# Patient Record
Sex: Female | Born: 1970 | Hispanic: No | Marital: Married | Smoking: Never smoker
Health system: Southern US, Community
[De-identification: ages and names within clinical notes are randomized; demographics above are authoritative.]

## PROBLEM LIST (undated history)

## (undated) DIAGNOSIS — G4700 Insomnia, unspecified: Secondary | ICD-10-CM

## (undated) DIAGNOSIS — F419 Anxiety disorder, unspecified: Secondary | ICD-10-CM

## (undated) HISTORY — PX: PARTIAL HYSTERECTOMY: SHX80

## (undated) HISTORY — DX: Insomnia, unspecified: G47.00

## (undated) HISTORY — PX: ECTOPIC PREGNANCY SURGERY: SHX613

## (undated) HISTORY — DX: Anxiety disorder, unspecified: F41.9

---

## 1998-02-08 ENCOUNTER — Inpatient Hospital Stay (HOSPITAL_COMMUNITY): Admission: AD | Admit: 1998-02-08 | Discharge: 1998-02-08 | Payer: Self-pay | Admitting: Obstetrics and Gynecology

## 1998-02-09 ENCOUNTER — Inpatient Hospital Stay (HOSPITAL_COMMUNITY): Admission: AD | Admit: 1998-02-09 | Discharge: 1998-02-11 | Payer: Self-pay | Admitting: Obstetrics & Gynecology

## 1998-03-17 ENCOUNTER — Other Ambulatory Visit: Admission: RE | Admit: 1998-03-17 | Discharge: 1998-03-17 | Payer: Self-pay | Admitting: Obstetrics and Gynecology

## 1999-06-18 ENCOUNTER — Other Ambulatory Visit: Admission: RE | Admit: 1999-06-18 | Discharge: 1999-06-18 | Payer: Self-pay | Admitting: Obstetrics and Gynecology

## 2000-06-17 ENCOUNTER — Other Ambulatory Visit: Admission: RE | Admit: 2000-06-17 | Discharge: 2000-06-17 | Payer: Self-pay | Admitting: Obstetrics and Gynecology

## 2001-09-06 ENCOUNTER — Encounter: Payer: Self-pay | Admitting: Obstetrics and Gynecology

## 2001-09-06 ENCOUNTER — Encounter (INDEPENDENT_AMBULATORY_CARE_PROVIDER_SITE_OTHER): Payer: Self-pay

## 2001-09-07 ENCOUNTER — Inpatient Hospital Stay (HOSPITAL_COMMUNITY): Admission: AD | Admit: 2001-09-07 | Discharge: 2001-09-08 | Payer: Self-pay | Admitting: Obstetrics and Gynecology

## 2001-09-20 ENCOUNTER — Other Ambulatory Visit: Admission: RE | Admit: 2001-09-20 | Discharge: 2001-09-20 | Payer: Self-pay | Admitting: Obstetrics and Gynecology

## 2003-05-08 ENCOUNTER — Other Ambulatory Visit: Admission: RE | Admit: 2003-05-08 | Discharge: 2003-05-08 | Payer: Self-pay | Admitting: Obstetrics and Gynecology

## 2004-05-20 ENCOUNTER — Other Ambulatory Visit: Admission: RE | Admit: 2004-05-20 | Discharge: 2004-05-20 | Payer: Self-pay | Admitting: Obstetrics and Gynecology

## 2004-10-02 ENCOUNTER — Ambulatory Visit (HOSPITAL_COMMUNITY): Admission: RE | Admit: 2004-10-02 | Discharge: 2004-10-02 | Payer: Self-pay | Admitting: Obstetrics and Gynecology

## 2005-05-26 ENCOUNTER — Inpatient Hospital Stay (HOSPITAL_COMMUNITY): Admission: RE | Admit: 2005-05-26 | Discharge: 2005-05-29 | Payer: Self-pay | Admitting: Obstetrics and Gynecology

## 2005-05-26 ENCOUNTER — Encounter (INDEPENDENT_AMBULATORY_CARE_PROVIDER_SITE_OTHER): Payer: Self-pay | Admitting: Specialist

## 2005-07-07 ENCOUNTER — Other Ambulatory Visit: Admission: RE | Admit: 2005-07-07 | Discharge: 2005-07-07 | Payer: Self-pay | Admitting: Obstetrics and Gynecology

## 2008-11-07 ENCOUNTER — Encounter (INDEPENDENT_AMBULATORY_CARE_PROVIDER_SITE_OTHER): Payer: Self-pay | Admitting: Obstetrics and Gynecology

## 2008-11-07 ENCOUNTER — Ambulatory Visit (HOSPITAL_COMMUNITY): Admission: RE | Admit: 2008-11-07 | Discharge: 2008-11-08 | Payer: Self-pay | Admitting: Obstetrics and Gynecology

## 2009-08-18 ENCOUNTER — Emergency Department (HOSPITAL_COMMUNITY): Admission: EM | Admit: 2009-08-18 | Discharge: 2009-08-18 | Payer: Self-pay | Admitting: Emergency Medicine

## 2010-08-05 LAB — DIFFERENTIAL
Basophils Absolute: 0 10*3/uL (ref 0.0–0.1)
Basophils Relative: 0 % (ref 0–1)
Eosinophils Absolute: 0.1 10*3/uL (ref 0.0–0.7)
Monocytes Relative: 6 % (ref 3–12)
Neutro Abs: 8.4 10*3/uL — ABNORMAL HIGH (ref 1.7–7.7)
Neutrophils Relative %: 80 % — ABNORMAL HIGH (ref 43–77)

## 2010-08-05 LAB — BASIC METABOLIC PANEL
BUN: 12 mg/dL (ref 6–23)
CO2: 25 mEq/L (ref 19–32)
Calcium: 8.9 mg/dL (ref 8.4–10.5)
Chloride: 108 mEq/L (ref 96–112)
Creatinine, Ser: 0.87 mg/dL (ref 0.4–1.2)
Glucose, Bld: 115 mg/dL — ABNORMAL HIGH (ref 70–99)

## 2010-08-05 LAB — URINALYSIS, ROUTINE W REFLEX MICROSCOPIC
Bilirubin Urine: NEGATIVE
Glucose, UA: NEGATIVE mg/dL
Ketones, ur: NEGATIVE mg/dL
Leukocytes, UA: NEGATIVE
Nitrite: NEGATIVE
Protein, ur: 30 mg/dL — AB
Specific Gravity, Urine: 1.025 (ref 1.005–1.030)
Urobilinogen, UA: 0.2 mg/dL (ref 0.0–1.0)
pH: 6.5 (ref 5.0–8.0)

## 2010-08-05 LAB — CBC
MCHC: 33.8 g/dL (ref 30.0–36.0)
MCV: 86.5 fL (ref 78.0–100.0)
RBC: 4.44 MIL/uL (ref 3.87–5.11)
RDW: 13.7 % (ref 11.5–15.5)

## 2010-08-05 LAB — URINE MICROSCOPIC-ADD ON

## 2010-08-24 LAB — CBC
Hemoglobin: 10.5 g/dL — ABNORMAL LOW (ref 12.0–15.0)
MCHC: 34.4 g/dL (ref 30.0–36.0)
MCV: 81 fL (ref 78.0–100.0)
Platelets: 245 10*3/uL (ref 150–400)
Platelets: 246 10*3/uL (ref 150–400)
RDW: 16 % — ABNORMAL HIGH (ref 11.5–15.5)
WBC: 12.2 10*3/uL — ABNORMAL HIGH (ref 4.0–10.5)
WBC: 5.5 10*3/uL (ref 4.0–10.5)

## 2010-08-24 LAB — PREGNANCY, URINE: Preg Test, Ur: NEGATIVE

## 2010-09-29 NOTE — Op Note (Signed)
Stacey Gonzales, Stacey Gonzales               ACCOUNT NO.:  [id number]   MEDICAL RECORD NO.:  [id number]          PATIENT TYPE:  OIB   LOCATION:  9307                          FACILITY:  [facility]   PHYSICIAN:  Michelle L. Grewal, M.D.DATE OF BIRTH:  Mar 20, 1971   DATE OF PROCEDURE:  11/07/2008  DATE OF DISCHARGE:                               OPERATIVE REPORT   PREOPERATIVE DIAGNOSIS:  Dysfunctional uterine bleeding.   POSTOPERATIVE DIAGNOSIS:  Dysfunctional uterine bleeding.   PROCEDURE:  Laparoscopic-assisted vaginal hysterectomy.   SURGEON:  Michelle L. Vincente Poli, MD   ASSISTANT:  Juluis Mire, MD   ANESTHESIA:  General.   PATHOLOGY:  Uterus and cervix sent for routine path.   DRAINS:  Foley.   ESTIMATED BLOOD LOSS:  200 mL.   DESCRIPTION OF PROCEDURE:  The patient was taken to the operating room  after informed consent was obtained.  She was intubated.  She was then  prepped and draped in the usual sterile fashion.  A uterine manipulator  was inserted, a Foley catheter was inserted and draining clear urine.  Attention was turned to the abdomen.  A small infraumbilical incision  was made and the Veress needle was inserted.  Pneumoperitoneum was  performed.  The Veress needle was removed and the 11-mm trocar was  inserted.  The laparoscope was introduced through the trocar sheath.  A  5-mm trocar was inserted suprapubically under direct visualization.  Exam of the pelvis revealed the following uterus was slightly enlarged.  She was status post bilateral tubal ligation.  Her ovaries were  unremarkable.  No endometriosis was noted.  I then placed an atraumatic  grasper and identified the triple pedicle and elevated it, and then  placed the Enseal instrument across the triple pedicle and burned the  triple pedicle and carried it down to the round ligament without any  difficulty.  This was done initially on the left side and then on the  right side.  Hemostasis was excellent.  At that point  we were removed  everything except for the trocars and released the pneumoperitoneum.  A  weighted speculum was placed in the vagina.  A circumferential incision  was made around the cervix.  The posterior cul-de-sac was entered  sharply using Mayo scissors and the anterior cul-de-sac was entered  sharply using Metzenbaum scissors.  Using the curved Heaney clamp, the  uterosacral cardinal ligament was clamped on either side.  Each pedicle  was clamped, cut, and suture ligated using 0 Vicryl suture.  We  carefully walked up the broad ligament staying just beside cervix and  the uterus and each pedicle was clamped, cut, and suture ligated using 0  Vicryl suture.  Once we reached the level of the uterine fundus, uterus  was retroflexed, the remainder of the broad ligament was clamped on  either side.  The specimen was removed and identified as uterus and  cervix.  The pedicles were secured using a suture ligature of 0 Vicryl  suture.  The posterior cuff was then closed from 3-9 o'clock in a  running locked stitch using a 0 Vicryl suture,  and then the cuff was  closed completely anterior to posterior using 0 Vicryl suture in a  running stitch.  At this point, I changed my gloves and went back up to  the abdomen.  The pneumoperitoneum was replaced.  The laparoscope was  introduced through the trocar sheath.  The irrigation of the pelvis was  performed.  Hemostasis was excellent.  I placed a piece of Intercede was  placed across the vaginal cuff to hopefully prevent adhesion formation  of the bowel to the vaginal cuff.  No bleeding was noted whatsoever.  The pneumoperitoneum was released.  Hemostasis was again noted.  The  trocars were removed.  The incisions were closed with the 0 Vicryl at  the umbilical with a deep stitch and then the remainder of the skin was  closed with a deep subcuticular using 3-0 Vicryl interrupted.  All  sponge, lap, and instrument counts were correct x2.  The patient  went to  recovery room in stable condition.      Michelle L. Vincente Poli, M.D.  Electronically Signed     MLG/MEDQ  D:  11/07/2008  T:  11/08/2008  Job:  [id number]

## 2010-09-29 NOTE — H&P (Signed)
Stacey Gonzales, Stacey Gonzales               ACCOUNT NO.:  [id number]   MEDICAL RECORD NO.:  [id number]          PATIENT TYPE:  AMB   LOCATION:  SDC                           FACILITY:  [facility]   PHYSICIAN:  Michelle L. Grewal, M.D.DATE OF BIRTH:  01-Jul-1970   DATE OF ADMISSION:  DATE OF DISCHARGE:                              HISTORY & PHYSICAL   Her surgery is on November 07, 2008, at [facility] at 7:30.   HISTORY OF PRESENT ILLNESS:  This patient is a 40 year old gravida 3,  para 2, LMP November 01, 2008, presents today for hysterectomy.  She had an  endometrial ablation in the past for menorrhagia and this helped  menorrhagia.  Her period now lasts for 2 weeks.  She has had a tubal  ligation in the past.  She has also had a history of an ectopic  pregnancy that ruptured and had a right salpingectomy in the past.  She  has also had 2 previous C-sections.   MEDICAL HISTORY:  Significant for history of abnormal Pap smear, history  of headaches and anxiety.   MEDICATIONS:  Paxil CR 12.5 mg a day.   SURGERY:  C-section x2.   ALLERGY:  SULFA.   SOCIAL HISTORY:  Denies any cigarettes or alcohol.  She is married.   REVIEW OF SYSTEMS:  Positive for fatigue, weight gain and insomnia and  headache.   PHYSICAL EXAMINATION:  VITAL SIGNS:  Weight 151, BP 126/80.  Hemoglobin  11.5.  GENERAL:  Alert and oriented.  No apparent distress.  LUNGS:  Clear to auscultation bilaterally.  CARDIAC:  Regular rate and rhythm.  BREASTS:  Soft, nontender, no masses.  PELVIC:  External genitalia within normal limits.  Vagina appears  normal.  Cervix, no lesions.  Uterus is normal size and nontender.  Adnexa nontender.  No masses.   IMPRESSION:  Dysfunctional uterine bleeding status post endometrial  ablation.   PLAN:  We will proceed with LAVH.  The risks and benefits have been  reviewed with the patient and we will proceed with the surgery as  scheduled.      Michelle L. Vincente Poli, M.D.  Electronically  Signed    MLG/MEDQ  D:  11/05/2008  T:  11/06/2008  Job:  [id number]

## 2010-10-02 NOTE — Op Note (Signed)
NAMERONNITA, PAZ               ACCOUNT NO.:  [id number]   MEDICAL RECORD NO.:  [id number]          PATIENT TYPE:  INP   LOCATION:  9116                          [facility]ACILITY:  [facility]   PHYSICIAN:  Michelle L. Grewal, M.D.DATE OF BIRTH:  [birth year]   DATE OF PROCEDURE:  05/26/2005  DATE OF DISCHARGE:                                 OPERATIVE REPORT   PREOPERATIVE DIAGNOSIS:  Intrauterine pregnancy at term, previous cesarean  section and desires permanent sterilization.   POSTOPERATIVE DIAGNOSIS:  Intrauterine pregnancy at term, previous cesarean  section and desires permanent sterilization.   PROCEDURE:  Repeat low transverse cesarean section and left salpingectomy.   SURGEON:  Michelle L. Vincente Poli, M.D.   ANESTHESIA:  Spinal.   SPECIMENS:  Female infant Apgars 8 at 1 minute and 9 at 5 minutes, weighing  6 pounds 10 ounces.   ESTIMATED BLOOD LOSS:  Was 500 mL.   COMPLICATIONS:  None.   PATHOLOGY:  Left fallopian tube.   PROCEDURE:  Patient was taken to the operating room. Spinal placed by  anesthesia. She was prepped and draped in usual sterile fashion. A Foley  catheter was inserted. A low transverse incision was made, carried down to  the fascia and extended laterally. Rectus muscles were separated in midline.  The peritoneum was entered bluntly. The bladder blade was inserted, lower  uterine segment was identified and the bladder flap was created sharply and  then digitally. The bladder blade was readjusted. A low transverse incision  was made in the uterus. The baby was delivered easily with one gentle pull  with a vacuum with no pop-off. She is a female infant with Apgars 8 at one  minute and 9 at one minute weighing 6 pounds 10 ounces. The cord was clamped  and cut. Baby was handed to the waiting pediatric team. Placenta was  manually removed and noted to be normal and intact with three-vessel cord.  The uterus was exteriorized and cleared of all clots and debris. She  was  noted to be status post right salpingectomy from history of ruptured right  ectopic pregnancy. Since the patient desires permanent sterilization. We had  easily identified a normal left fallopian tube and it was grasped in the  midportion with Babcock clamp and a 3 cm knuckle was tied off x2 with plain  gut suture. That excised knuckle was then excised using Metzenbaum scissors.  Hemostasis was noted. The incision was closed using 0 chromic suture running  locked stitch. The uterus was returned to the abdomen. Irrigation was  performed. Hemostasis was again noted. The peritoneum was closed using 0  Vicryl suture in continuous running stitch and the rectus muscles were  reapproximated using same 0 Vicryl. The  fascia was closed using 0 Vicryl suture in continuous running stitch x2  starting at each corner and meeting in the midline. After irrigation of  subcutaneous layers, skin was closed with staples. All sponge, lap and  instrument counts were correct x2. The patient went to recovery room in  stable condition.      Michelle L. Vincente Poli, M.D.  Electronically Signed  MLG/MEDQ  D:  05/26/2005  T:  05/26/2005  Job:  [id number]

## 2010-10-02 NOTE — Op Note (Signed)
[facility]  Patient:    Stacey Gonzales, Stacey Gonzales Visit Number: [id number] MRN: [id number]          Service Type: OBV Location: 9300 [id number] Attending Physician:  Rhina Brackett Dictated by:   Marcelle Overlie, M.D. Admit Date:  09/06/2001                             Operative Report  PREOPERATIVE DIAGNOSIS:       Ruptured right ectopic pregnancy.  POSTOPERATIVE DIAGNOSIS:      Ruptured right ectopic pregnancy.  OPERATION:                    Exploratory laparotomy, right salpingectomy and removal of hemoperitoneum.  SURGEON:                      Marcelle Overlie, M.D.  ANESTHESIA:                   General.  ESTIMATED BLOOD LOSS:         300 cc.  DESCRIPTION OF PROCEDURE:     The patient was taken to the operating room emergently.  She was then sedated without difficulty and she was prepped and draped in the usual sterile fashion after Foley catheter was placed in the bladder.  Using the scalpel, a small mini laparotomy incision was made at the area of her previous cesarean section scar and carried down to the fascia. The fascia was scored in the midline and extended laterally.  The rectus muscles were separated both superiorly and inferiorly.  The peritoneum was entered bluntly and the peritoneal incision was then stretched.  Upon entering into the abdominal cavity, there was noted to be approximately 300 cc of hemoperitoneum, some with organized clots.  As the uterus was lifted up to the incision, the uterus appeared normal.  The left tube and ovary appeared normal in appearance and the right tube was extremely distorted and bleeding quite heavily from an ectopic pregnancy which was partially extruded from the fimbriated end.  The right ovary appeared normal.  Because of the extreme distortion of the fallopian tube and the large amount of bleeding, a salpingectomy was performed by placing a Kelly clamp across the tube.  The tube was removed  using Mayo scissors and the pedicle was secured using a free-tie of 0 Vicryl suture.  Irrigation was then performed and as much hemoperitoneum was removed as possible.  The surgical site was inspected and noted to be hemostatic.  The peritoneum was closed using 0 Vicryl in continuous running stitch and the rectus muscles were reapproximated using the same 0 Vicryl.  The fascia was closed using 0 Vicryl in continuous running stitch and after irrigation of the subcutaneous layer, the skin was closed with subcuticular stitches using 4-0 Monocryl.  All sponge, lab and instrument counts were correct x 2.  The patient tolerated the procedure well and went to the recovery room in stable condition. Dictated by:   Marcelle Overlie, M.D. Attending Physician:  Rhina Brackett DD:  09/06/01 TD:  09/06/01 Job: [id number]

## 2010-10-02 NOTE — Discharge Summary (Signed)
[name], Stacey Gonzales               ACCOUNT NO.:  [id number]   MEDICAL RECORD NO.:  [id number]          PATIENT TYPE:  INP   LOCATION:  9116                          FACILITY:  [facility]   PHYSICIAN:  Freddy Finner, M.D.   DATE OF BIRTH:  02-13-1971   DATE OF ADMISSION:  05/26/2005  DATE OF DISCHARGE:  05/29/2005                                 DISCHARGE SUMMARY   ADMITTING DIAGNOSES:  1.  Intrauterine pregnancy at term.  2.  Previous cesarean section, desires repeat.  3.  Multiparity, desires permanent sterilization.   DISCHARGE DIAGNOSES:  1.  Status post low transverse cesarean section.  2.  Viable female infant.  3.  Permanent sterilization.   PROCEDURE:  1.  Repeat low transverse cesarean section.  2.  Left salpingectomy.   REASON FOR ADMISSION:  Please see written H&P.   HOSPITAL COURSE:  The patient is a 40 year old gravida 3, para 1 who  presented to [facility] for a scheduled repeat cesarean  section and permanent sterilization. The patient had a history of an  exploratory laparotomy for an ectopic pregnancy with previous right  salpingectomy. The patient also had had a previous cesarean section and  desired repeat. Due to multiparity, the patient also requested permanent  sterilization. On the morning of admission, the patient was taken to the  operating room where spinal anesthesia was administered without difficulty.  A low transverse incision was made with delivery of a viable female infant  weighing 6 pounds 10 ounces, Apgars of 9 and one minute and 9 at five  minutes. Left salpingectomy was performed without difficulty. The patient  tolerated the procedure well and was taken to the recovery room in stable  condition.   On postoperative day #1, the patient was without complaint. Vital signs were  stable. She was afebrile. Abdomen was soft with good return of bowel  function. Fundus was firm and nontender. Abdominal dressing was noted to be  clean,  dry and intact. Laboratory findings revealed hemoglobin of 9.0,  platelet count of 219,000, WBC count of 8.2.   On postoperative day #2, the patient was without complaint. Vital signs were  stable.  She was afebrile. Abdomen was soft with good return of bowel  function. Fundus was firm and nontender. Incision was clean, dry and intact.  The patient was ambulating well and tolerating a regular diet without  complaints of nausea and vomiting.   On postoperative day #3, the patient was without complaint. Vital signs  remained stable. She was afebrile. Fundus was firm and nontender. Incision  was clean, dry and intact. Staples were removed, and the patient was later  discharged home.   CONDITION ON DISCHARGE:  Good.   DIET:  Regular as tolerated.   ACTIVITY:  No heavy lifting, no driving x2 weeks, no vaginal entry.   FOLLOW UP:  Patient to follow up in the office in 1 week for an incision  check. She is to call for temperature greater than 100 degrees, persistent  nausea or vomiting, heavy vaginal bleeding and/or redness or drainage from  the incisional site.  DISCHARGE MEDICATIONS:  1.  Percocet 5/325, #30, one p.o. q.4-6h. p.r.n.  2.  Motrin 600 milligrams every 6 hours.  3.  Prenatal vitamins one p.o. b.i.d. with meals.      Julio Sicks, N.P.      Freddy Finner, M.D.  Electronically Signed    CC/MEDQ  D:  06/14/2005  T:  06/14/2005  Job:  [id number]

## 2010-10-02 NOTE — Discharge Summary (Signed)
[facility]  Patient:    Stacey Gonzales, Stacey Gonzales Visit Number: [id number] MRN: [id number]          Service Type: OBS Location: 9300 [id number] Attending Physician:  Rhina Brackett Dictated by:   Julio Sicks, N.P. Admit Date:  09/06/2001 Discharge Date: 09/08/2001                             Discharge Summary  ADMITTING DIAGNOSIS:          Ruptured right ectopic pregnancy.  DISCHARGE DIAGNOSIS:          Ruptured right ectopic pregnancy, status post laparotomy and right salpingectomy.  PROCEDURE:                    Exploratory laparotomy, right salpingectomy, and removal of hemoperitoneum.  REASON FOR ADMISSION:         Please see dictated H&P.  HOSPITAL COURSE:              The patient presents to maternity admissions with complaints of right lower quadrant pain.  The patient was seen in the office with a positive pregnancy test and some acute abdominal pain. Ultrasound was ordered, which revealed a right ectopic pregnancy with some hemoperitoneum.  The patient was taken to the operating room emergently for an exploratory laparotomy.  Ectopic pregnancy was noted in the right tube with heavy bleeding.  A right salpingectomy was performed without complication.  On postoperative day #1, the patient had good return of bowel function.  The abdomen was slightly tender.  The bandage was clean, dry, and intact.  Labs revealed a hemoglobin of 9.0, hematocrit of 26.4, WBC of 5.8.  On postoperative day #2, the patient was doing well.  Temperature was noted to be slightly elevated at 100.1 degrees Fahrenheit maximum.  The abdomen was soft and slightly tender but without rebound tenderness.  A low-grade temperature was thought to be related to hemoperitoneum.  The incision was clean, dry, and intact and the patient was discharged home.  CONDITION ON DISCHARGE:       Good.  DIET:                         Regular as tolerated.  ACTIVITY:                     No  heavy lifting, no driving x2 weeks, no vaginal entry.  FOLLOWUP:                     The patient is to follow up in the office in two weeks for an incision check.  The patient is to call for temperature greater than 100.5 degrees, persistent nausea and vomiting, heavy vaginal bleeding, and/or redness or drainage from the incision site.  DISCHARGE MEDICATIONS:        1. Tylox 1-2 every 4-6 hours p.r.n. pain.                               2. Motrin 600 mg over-the-counter every six                                  hours p.r.n.  3. Restoril 30 mg 1 p.o. at h.s. p.r.n.                                  insomnia. Dictated by:   Julio Sicks, N.P. Attending Physician:  Rhina Brackett DD:  09/27/01 TD:  09/30/01 Job: [id number]

## 2011-10-07 IMAGING — CT CT ABD-PELV W/O CM
2 of 4 series · 17 of 46 positions shown, 19 images · non-contrast
Comparison: None.

CLINICAL DATA: Left flank pain

CT ABDOMEN AND PELVIS WITHOUT CONTRAST
TECHNIQUE: Multidetector CT imaging of the abdomen and pelvis was
performed following the standard protocol without intravenous
contrast.

[Series 2: under 200# stone no prev · axial · 0.74mm/px · z∈[-468,-98]mm · 14 of 80 slices shown, 16 images]
[im 3/80  soft-tissue]
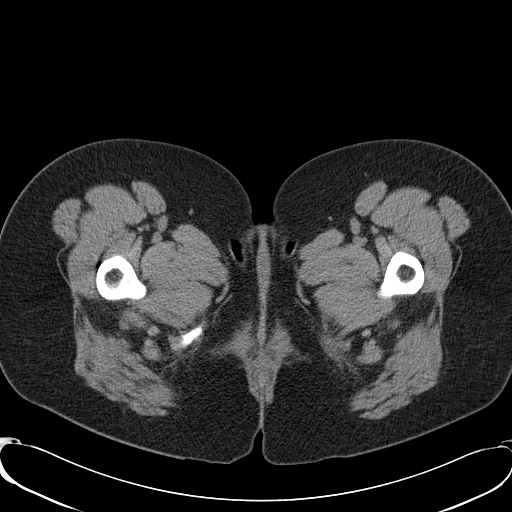
[im 3/80  bone]
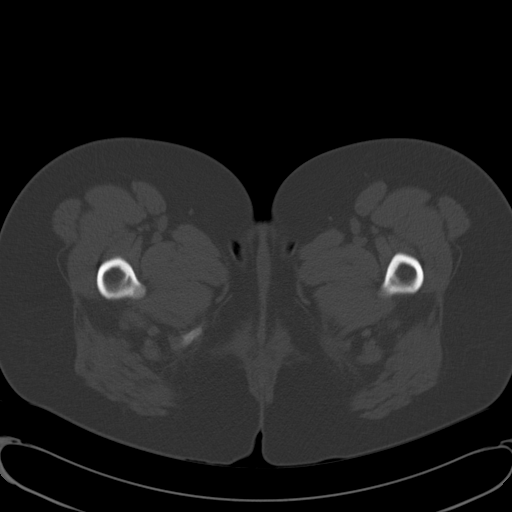
[im 9/80  soft-tissue]
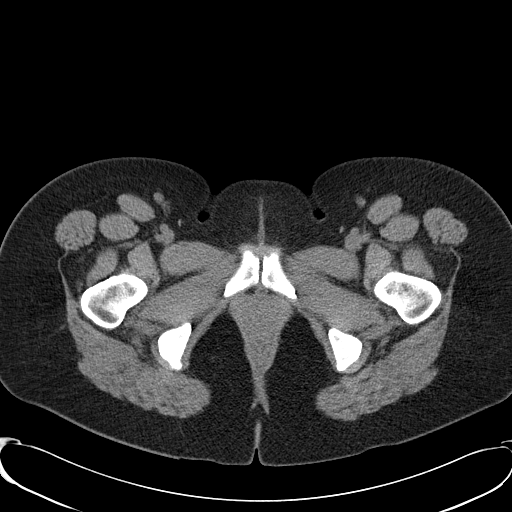
[im 15/80  soft-tissue]
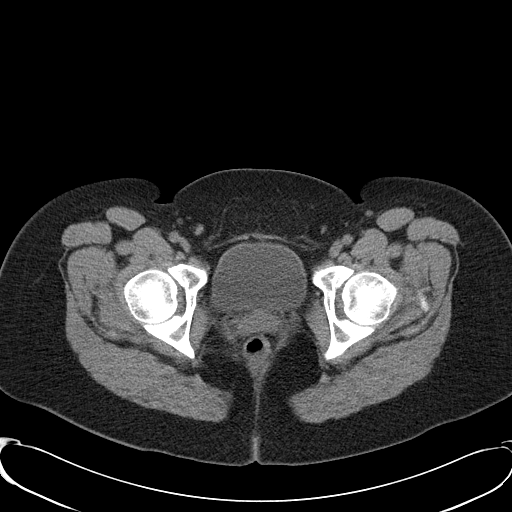
[im 21/80  soft-tissue]
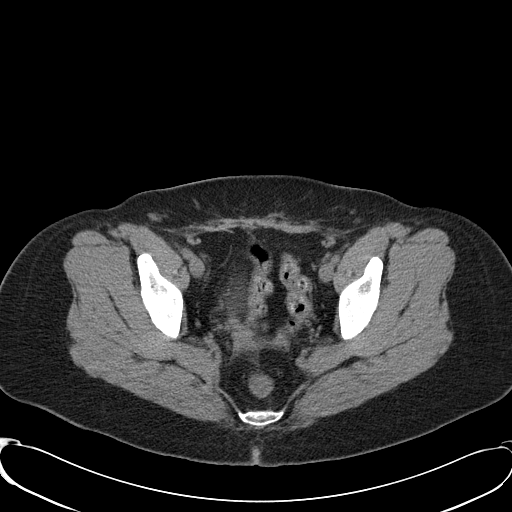
[im 27/80  soft-tissue]
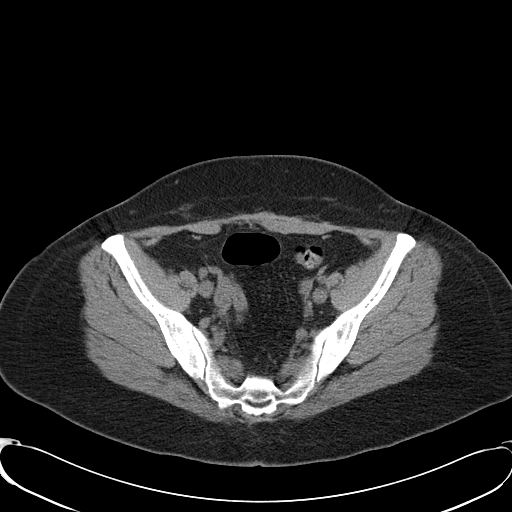
[im 33/80  soft-tissue]
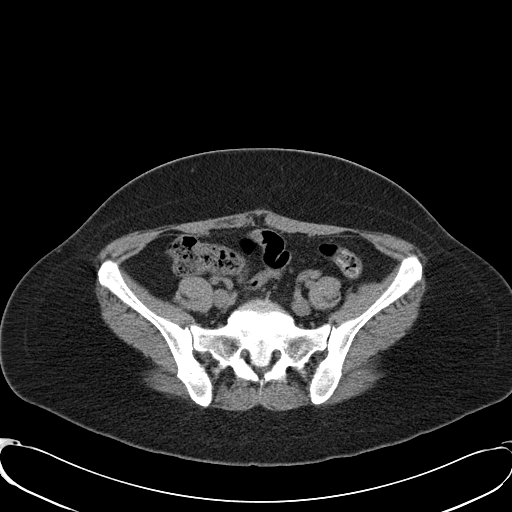
[im 39/80  soft-tissue]
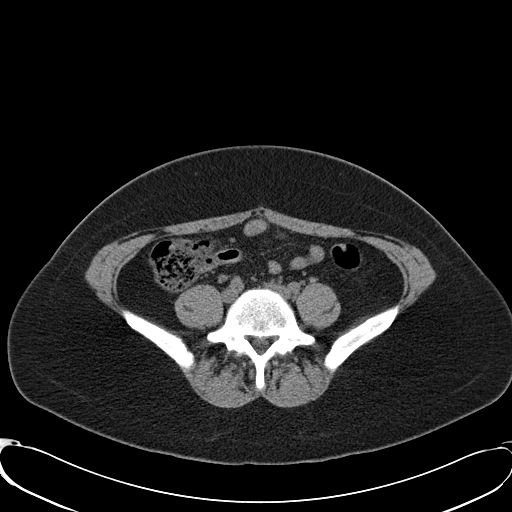
[im 41/80  soft-tissue]
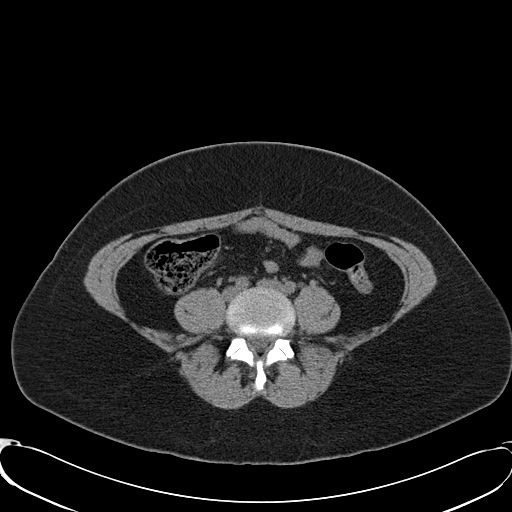
[im 47/80  soft-tissue]
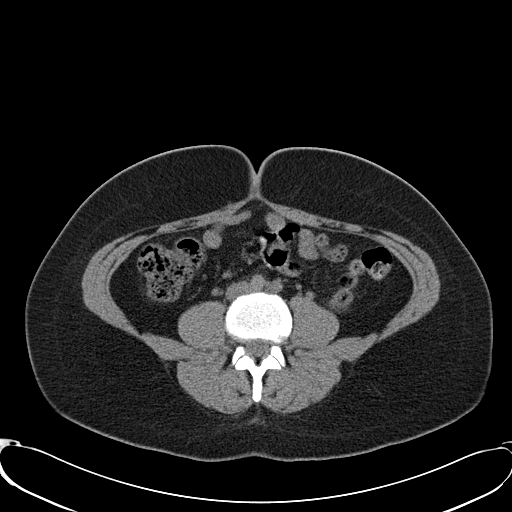
[im 47/80  bone]
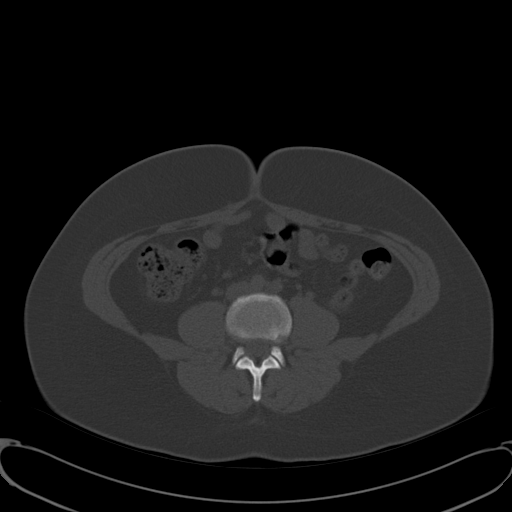
[im 53/80  soft-tissue]
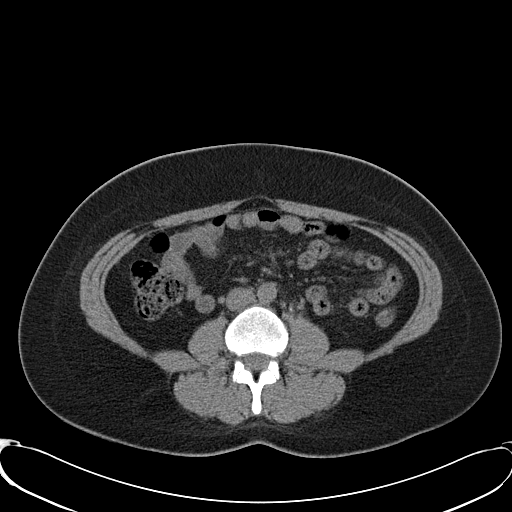
[im 59/80  soft-tissue]
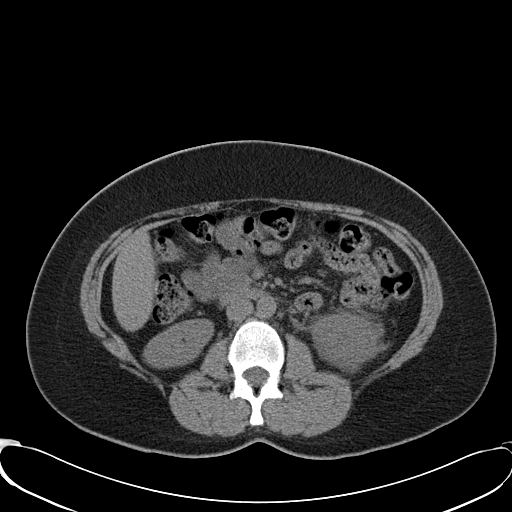
[im 65/80  soft-tissue]
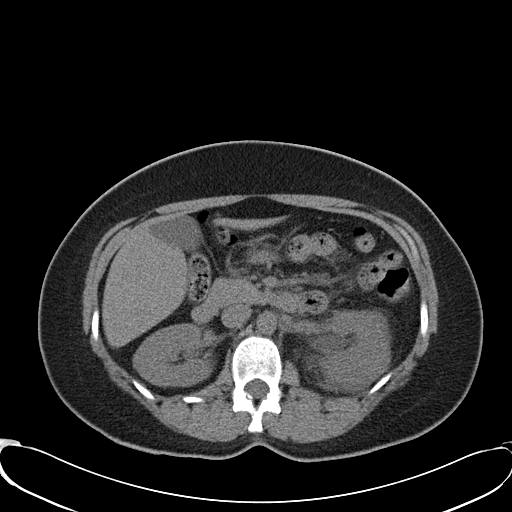
[im 71/80  soft-tissue]
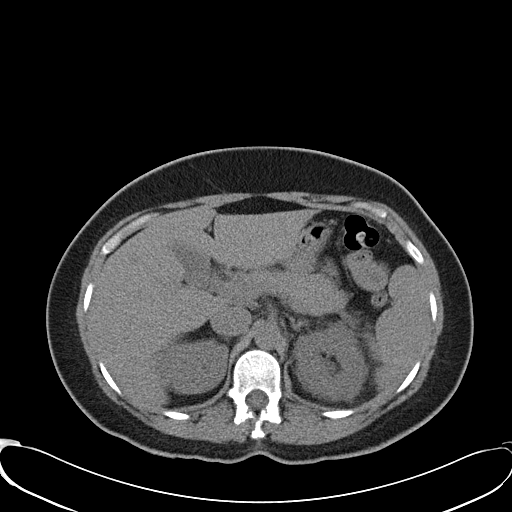
[im 77/80  soft-tissue]
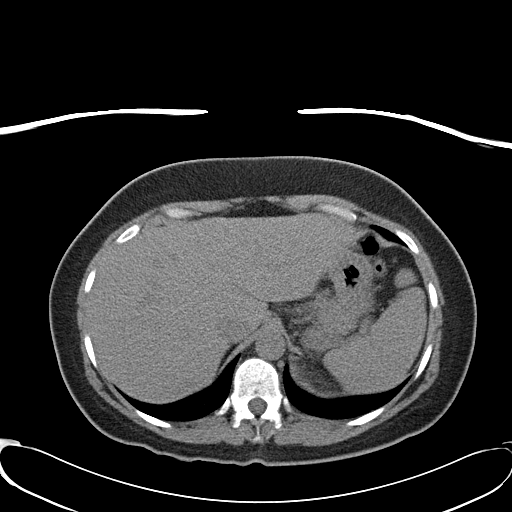

[Series 602: <mpr thick range> · coronal · 0.78mm/px · 3 of 78 slices shown]
[im 26/78  soft-tissue]
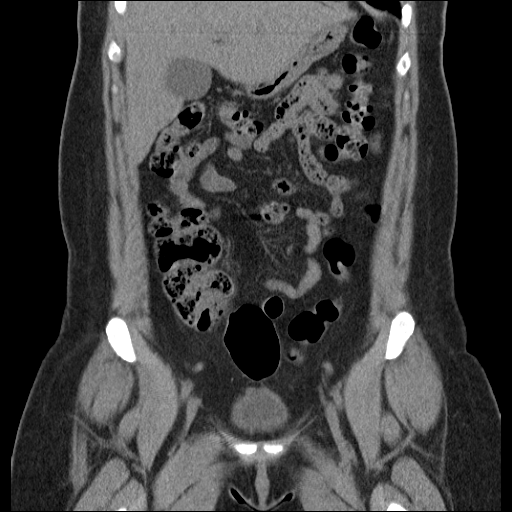
[im 35/78  soft-tissue]
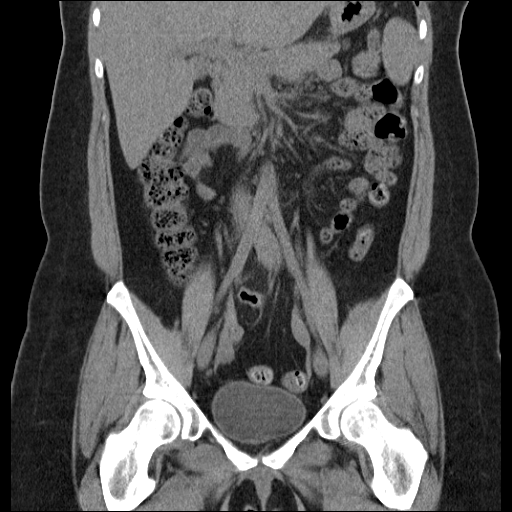
[im 43/78  soft-tissue]
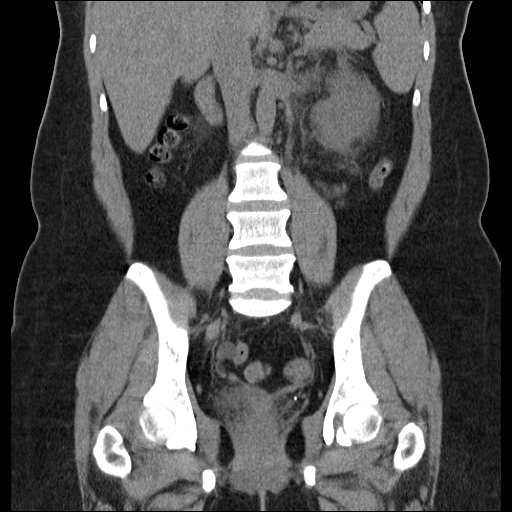

[17 of 46 positions shown; findings below may reference images not displayed]

FINDINGS: The lung bases are clear.  There are no renal calculi.
There is, however, left hydronephrosis and hydroureter with
perinephric stranding.  The ureter is dilated down to the image
number 27 of series 2, where there is a 2.5 x 3.7 x 3.9 mm calculus
in the proximal left ureter.  No lower ureteral or bladder calculi.
No masses or fluid collections.  Abdominal viscera unremarkable in
the unenhanced state. No abnormality of the GI tract is evident.
No calcified gallstones.
IMPRESSION: 2.5 x 3.7 x 3.9 mm calculus in the proximal left ureter, causing
moderate obstructive uropathy.

## 2012-01-07 ENCOUNTER — Other Ambulatory Visit: Payer: Self-pay | Admitting: Obstetrics and Gynecology

## 2013-02-19 ENCOUNTER — Other Ambulatory Visit: Payer: Self-pay | Admitting: Obstetrics and Gynecology

## 2013-03-14 ENCOUNTER — Other Ambulatory Visit (HOSPITAL_COMMUNITY): Payer: Self-pay | Admitting: Obstetrics and Gynecology

## 2013-03-14 ENCOUNTER — Ambulatory Visit (HOSPITAL_COMMUNITY)
Admission: RE | Admit: 2013-03-14 | Discharge: 2013-03-14 | Disposition: A | Discharge status: Home / Self Care | Payer: BC Managed Care – PPO | Source: Ambulatory Visit | Attending: Obstetrics and Gynecology | Admitting: Obstetrics and Gynecology

## 2013-03-14 DIAGNOSIS — R109 Unspecified abdominal pain: Secondary | ICD-10-CM | POA: Insufficient documentation

## 2013-03-14 DIAGNOSIS — R1110 Vomiting, unspecified: Secondary | ICD-10-CM

## 2013-03-14 DIAGNOSIS — R52 Pain, unspecified: Secondary | ICD-10-CM | POA: Insufficient documentation

## 2014-04-08 ENCOUNTER — Other Ambulatory Visit: Payer: Self-pay | Admitting: Obstetrics and Gynecology

## 2014-04-09 LAB — CYTOLOGY - PAP

## 2018-12-18 ENCOUNTER — Encounter: Payer: Self-pay | Admitting: Gastroenterology

## 2019-01-29 ENCOUNTER — Ambulatory Visit (AMBULATORY_SURGERY_CENTER): Payer: Self-pay

## 2019-01-29 ENCOUNTER — Other Ambulatory Visit: Payer: Self-pay

## 2019-01-29 VITALS — Ht 60.5 in | Wt 143.0 lb

## 2019-01-29 DIAGNOSIS — Z1211 Encounter for screening for malignant neoplasm of colon: Secondary | ICD-10-CM

## 2019-01-29 MED ORDER — PEG 3350-KCL-NA BICARB-NACL 420 G PO SOLR: 4000.0000 mL | Freq: Once | ORAL | 0 refills | Status: AC

## 2019-01-29 NOTE — Progress Notes (Signed)
Denies allergies to eggs or soy products. Denies complication of anesthesia or sedation. Denies use of weight loss medication. Denies use of O2.   Emmi instructions given for colonoscopy.  

## 2019-01-31 ENCOUNTER — Encounter: Payer: Self-pay | Admitting: Gastroenterology

## 2019-02-09 ENCOUNTER — Telehealth: Payer: Self-pay | Admitting: Gastroenterology

## 2019-02-09 NOTE — Telephone Encounter (Signed)
Pt returned call and answered “No” to all questions.  °  °Pt made aware of that care partner may come to the lobby during the procedure but will need to provide their own mask. ° ° °

## 2019-02-09 NOTE — Telephone Encounter (Signed)

## 2019-02-12 ENCOUNTER — Other Ambulatory Visit: Payer: Self-pay

## 2019-02-12 ENCOUNTER — Encounter: Payer: Self-pay | Admitting: Gastroenterology

## 2019-02-12 ENCOUNTER — Ambulatory Visit (AMBULATORY_SURGERY_CENTER): Payer: BC Managed Care – PPO | Admitting: Gastroenterology

## 2019-02-12 VITALS — BP 124/73 | HR 81 | Temp 98.1°F | Resp 11 | Ht 60.0 in | Wt 143.0 lb

## 2019-02-12 DIAGNOSIS — Z1211 Encounter for screening for malignant neoplasm of colon: Secondary | ICD-10-CM

## 2019-02-12 MED ORDER — SODIUM CHLORIDE 0.9 % IV SOLN: 500.0000 mL | Freq: Once | INTRAVENOUS | Status: DC

## 2019-02-12 NOTE — Op Note (Signed)
[facility] Patient Name: Stacey LenisCandy Arrick Procedure Date: 02/12/2019 7:21 AM MRN: [id number] Endoscopist: Rachael Feeaniel P Jacobs , MD Age: 48 Referring MD:  Date of Birth: Oct 13, 1970 Gender: Female Account #: [id number] Procedure:                Colonoscopy Indications:              Screening for colorectal malignant neoplasm Medicines:                Monitored Anesthesia Care Procedure:                Pre-Anesthesia Assessment:                           - Prior to the procedure, a History and Physical                            was performed, and patient medications and                            allergies were reviewed. The patient's tolerance of                            previous anesthesia was also reviewed. The risks                            and benefits of the procedure and the sedation                            options and risks were discussed with the patient.                            All questions were answered, and informed consent                            was obtained. Prior Anticoagulants: The patient has                            taken no previous anticoagulant or antiplatelet                            agents. ASA Grade Assessment: II - A patient with                            mild systemic disease. After reviewing the risks                            and benefits, the patient was deemed in                            satisfactory condition to undergo the procedure.                           After obtaining informed consent, the colonoscope  was passed under direct vision. Throughout the                            procedure, the patient's blood pressure, pulse, and                            oxygen saturations were monitored continuously. The                            LOANER 0255 was introduced through the anus and                            advanced to the the cecum, identified by                            appendiceal orifice and  ileocecal valve. The                            colonoscopy was performed without difficulty. The                            patient tolerated the procedure well. The quality                            of the bowel preparation was excellent. Scope In: 8:01:07 AM Scope Out: 8:11:27 AM Scope Withdrawal Time: 0 hours 6 minutes 3 seconds  Total Procedure Duration: 0 hours 10 minutes 20 seconds  Findings:                 External hemorrhoids were found. The hemorrhoids                            were small.                           The exam was otherwise without abnormality on                            direct and retroflexion views. Complications:            No immediate complications. Estimated blood loss:                            None. Estimated Blood Loss:     Estimated blood loss: none. Impression:               - External hemorrhoids.                           - The examination was otherwise normal on direct                            and retroflexion views.                           - No polyps or cancers. Recommendation:           -  Patient has a contact number available for                            emergencies. The signs and symptoms of potential                            delayed complications were discussed with the                            patient. Return to normal activities tomorrow.                            Written discharge instructions were provided to the                            patient.                           - Resume previous diet.                           - Continue present medications.                           - Repeat colonoscopy in 10 years for screening. Milus Banister, MD 02/12/2019 8:13:39 AM This report has been signed electronically.

## 2019-02-12 NOTE — Patient Instructions (Signed)
Read all of the handouts given to you by your recovery room nurse.  Thank-you for choosing [place] for your healthcare needs today.  YOU HAD AN ENDOSCOPIC PROCEDURE TODAY AT [facility]:   Refer to the procedure report that was given to you for any specific questions about what was found during the examination.  If the procedure report does not answer your questions, please call your gastroenterologist to clarify.  If you requested that your care partner not be given the details of your procedure findings, then the procedure report has been included in a sealed envelope for you to review at your convenience later.  YOU SHOULD EXPECT: Some feelings of bloating in the abdomen. Passage of more gas than usual.  Walking can help get rid of the air that was put into your GI tract during the procedure and reduce the bloating. If you had a lower endoscopy (such as a colonoscopy or flexible sigmoidoscopy) you may notice spotting of blood in your stool or on the toilet paper. If you underwent a bowel prep for your procedure, you may not have a normal bowel movement for a few days.  Please Note:  You might notice some irritation and congestion in your nose or some drainage.  This is from the oxygen used during your procedure.  There is no need for concern and it should clear up in a day or so.  SYMPTOMS TO REPORT IMMEDIATELY:   Following lower endoscopy (colonoscopy or flexible sigmoidoscopy):  Excessive amounts of blood in the stool  Significant tenderness or worsening of abdominal pains  Swelling of the abdomen that is new, acute  Fever of 100F or higher   For urgent or emergent issues, a gastroenterologist can be reached at any hour by calling [phone].   DIET:  We do recommend a small meal at first, but then you may proceed to your regular diet.  Drink plenty of fluids but you should avoid alcoholic beverages for 24 hours.  ACTIVITY:  You should plan to take it easy for the rest of  today and you should NOT DRIVE or use heavy machinery until tomorrow (because of the sedation medicines used during the test).    FOLLOW UP: Our staff will call the number listed on your records 48-72 hours following your procedure to check on you and address any questions or concerns that you may have regarding the information given to you following your procedure. If we do not reach you, we will leave a message.  We will attempt to reach you two times.  During this call, we will ask if you have developed any symptoms of COVID 19. If you develop any symptoms (ie: fever, flu-like symptoms, shortness of breath, cough etc.) before then, please call [phone].  If you test positive for Covid 19 in the 2 weeks post procedure, please call and report this information to [place].     SIGNATURES/CONFIDENTIALITY: You and/or your care partner have signed paperwork which will be entered into your electronic medical record.  These signatures attest to the fact that that the information above on your After Visit Summary has been reviewed and is understood.  Full responsibility of the confidentiality of this discharge information lies with you and/or your care-partner.

## 2019-02-12 NOTE — Progress Notes (Signed)
Temp check by KA/vital check by CW.  Patient states no changes in medical or surgical history since pre-visit screening on 01/29/19.   

## 2019-02-12 NOTE — Progress Notes (Signed)
To PACU VSS. Report to [email or web address] 

## 2019-02-14 ENCOUNTER — Telehealth: Payer: Self-pay | Admitting: *Deleted

## 2019-02-14 NOTE — Telephone Encounter (Signed)
1. Have you developed a fever since your procedure? no  2.   Have you had an respiratory symptoms (SOB or cough) since your procedure? no  3.   Have you tested positive for COVID 19 since your procedure no  4.   Have you had any family members/close contacts diagnosed with the COVID 19 since your procedure?  no   If yes to any of these questions please route to Joylene John, RN and Alphonsa Gin, Therapist, sports.  Follow up Call-  Call back number 02/12/2019  Post procedure Call Back phone  # [phone]  Permission to leave phone message Yes  Some recent data might be hidden     Patient questions:  Do you have a fever, pain , or abdominal swelling? No. Pain Score  0 *  Have you tolerated food without any problems? No.  Have you been able to return to your normal activities? Yes.    Do you have any questions about your discharge instructions: Diet   No. Medications  No. Follow up visit  no  Do you have questions or concerns about your Care? Yes.    Actions: * If pain score is 4 or above: No action needed, pain <4.  Patient stating she had a bad headache once she woke from a nap on the day of the procedure and body aches. No fever, no other symptoms of COVID. No known exposure. Patient with mild headache yesterday, completely back to normal today. Encouraged to drink plenty and call if any symptoms of COVID occur

## 2022-05-03 ENCOUNTER — Other Ambulatory Visit: Payer: Self-pay | Admitting: Physician Assistant

## 2022-05-03 DIAGNOSIS — Z8249 Family history of ischemic heart disease and other diseases of the circulatory system: Secondary | ICD-10-CM

## 2022-06-14 ENCOUNTER — Encounter: Payer: Self-pay | Admitting: Physician Assistant

## 2022-06-15 ENCOUNTER — Other Ambulatory Visit: Payer: BC Managed Care – PPO
